# Patient Record
Sex: Female | Born: 1955 | Race: White | Hispanic: No | Smoking: Never smoker
Health system: Southern US, Community
[De-identification: ages and names within clinical notes are randomized; demographics above are authoritative.]

## PROBLEM LIST (undated history)

## (undated) DIAGNOSIS — I10 Essential (primary) hypertension: Secondary | ICD-10-CM

## (undated) DIAGNOSIS — E039 Hypothyroidism, unspecified: Secondary | ICD-10-CM

## (undated) DIAGNOSIS — M1990 Unspecified osteoarthritis, unspecified site: Secondary | ICD-10-CM

## (undated) DIAGNOSIS — G5600 Carpal tunnel syndrome, unspecified upper limb: Secondary | ICD-10-CM

## (undated) DIAGNOSIS — J302 Other seasonal allergic rhinitis: Secondary | ICD-10-CM

## (undated) DIAGNOSIS — Z973 Presence of spectacles and contact lenses: Secondary | ICD-10-CM

## (undated) HISTORY — PX: COLONOSCOPY: SHX174

## (undated) HISTORY — DX: Other seasonal allergic rhinitis: J30.2

## (undated) HISTORY — PX: TUBAL LIGATION: SHX77

## (undated) HISTORY — DX: Unspecified osteoarthritis, unspecified site: M19.90

## (undated) HISTORY — PX: BREAST SURGERY: SHX581

---
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
## 1986-09-10 HISTORY — PX: BREAST EXCISIONAL BIOPSY: SUR124

## 1989-09-10 HISTORY — PX: TUBAL LIGATION: SHX77

## 2000-09-10 HISTORY — PX: BREAST EXCISIONAL BIOPSY: SUR124

## 2002-08-13 ENCOUNTER — Encounter: Payer: Self-pay | Admitting: *Deleted

## 2002-08-13 ENCOUNTER — Encounter: Admission: RE | Admit: 2002-08-13 | Discharge: 2002-08-13 | Payer: Self-pay | Admitting: *Deleted

## 2003-10-19 ENCOUNTER — Encounter: Admission: RE | Admit: 2003-10-19 | Discharge: 2003-10-19 | Payer: Self-pay | Admitting: *Deleted

## 2004-10-27 ENCOUNTER — Encounter: Admission: RE | Admit: 2004-10-27 | Discharge: 2004-10-27 | Payer: Self-pay | Admitting: *Deleted

## 2005-10-29 ENCOUNTER — Encounter: Admission: RE | Admit: 2005-10-29 | Discharge: 2005-10-29 | Payer: Self-pay | Admitting: *Deleted

## 2006-10-31 ENCOUNTER — Encounter: Admission: RE | Admit: 2006-10-31 | Discharge: 2006-10-31 | Payer: Self-pay | Admitting: *Deleted

## 2007-11-03 ENCOUNTER — Encounter: Admission: RE | Admit: 2007-11-03 | Discharge: 2007-11-03 | Payer: Self-pay | Admitting: *Deleted

## 2008-11-03 ENCOUNTER — Encounter: Admission: RE | Admit: 2008-11-03 | Discharge: 2008-11-03 | Payer: Self-pay | Admitting: *Deleted

## 2009-05-18 ENCOUNTER — Ambulatory Visit: Payer: Self-pay | Admitting: Internal Medicine

## 2009-06-03 ENCOUNTER — Encounter: Payer: Self-pay | Admitting: Internal Medicine

## 2009-06-03 ENCOUNTER — Ambulatory Visit: Payer: Self-pay | Admitting: Internal Medicine

## 2009-06-06 ENCOUNTER — Encounter: Payer: Self-pay | Admitting: Internal Medicine

## 2009-11-07 ENCOUNTER — Encounter: Admission: RE | Admit: 2009-11-07 | Discharge: 2009-11-07 | Payer: Self-pay | Admitting: *Deleted

## 2010-10-03 ENCOUNTER — Other Ambulatory Visit: Payer: Self-pay | Admitting: *Deleted

## 2010-10-03 DIAGNOSIS — Z1239 Encounter for other screening for malignant neoplasm of breast: Secondary | ICD-10-CM

## 2010-11-13 ENCOUNTER — Ambulatory Visit: Payer: Self-pay

## 2010-11-20 ENCOUNTER — Ambulatory Visit
Admission: RE | Admit: 2010-11-20 | Discharge: 2010-11-20 | Disposition: A | Discharge status: Home / Self Care | Payer: 59 | Source: Ambulatory Visit | Attending: *Deleted | Admitting: *Deleted

## 2010-11-20 DIAGNOSIS — Z1239 Encounter for other screening for malignant neoplasm of breast: Secondary | ICD-10-CM

## 2011-07-02 ENCOUNTER — Other Ambulatory Visit: Payer: Self-pay | Admitting: Dermatology

## 2011-07-24 ENCOUNTER — Other Ambulatory Visit: Payer: Self-pay | Admitting: Obstetrics and Gynecology

## 2011-11-22 ENCOUNTER — Other Ambulatory Visit: Payer: Self-pay | Admitting: *Deleted

## 2011-11-22 DIAGNOSIS — Z1231 Encounter for screening mammogram for malignant neoplasm of breast: Secondary | ICD-10-CM

## 2011-12-07 ENCOUNTER — Ambulatory Visit: Payer: 59

## 2011-12-17 ENCOUNTER — Ambulatory Visit
Admission: RE | Admit: 2011-12-17 | Discharge: 2011-12-17 | Disposition: A | Discharge status: Home / Self Care | Payer: 59 | Source: Ambulatory Visit | Attending: *Deleted | Admitting: *Deleted

## 2011-12-17 DIAGNOSIS — Z1231 Encounter for screening mammogram for malignant neoplasm of breast: Secondary | ICD-10-CM

## 2012-07-07 ENCOUNTER — Other Ambulatory Visit: Payer: Self-pay | Admitting: Dermatology

## 2012-08-05 ENCOUNTER — Other Ambulatory Visit: Payer: Self-pay | Admitting: Obstetrics and Gynecology

## 2012-12-11 ENCOUNTER — Other Ambulatory Visit: Payer: Self-pay

## 2012-12-11 DIAGNOSIS — Z1231 Encounter for screening mammogram for malignant neoplasm of breast: Secondary | ICD-10-CM

## 2013-01-05 ENCOUNTER — Ambulatory Visit
Admission: RE | Admit: 2013-01-05 | Discharge: 2013-01-05 | Disposition: A | Discharge status: Home / Self Care | Payer: 59 | Source: Ambulatory Visit

## 2013-01-05 DIAGNOSIS — Z1231 Encounter for screening mammogram for malignant neoplasm of breast: Secondary | ICD-10-CM

## 2013-01-22 ENCOUNTER — Other Ambulatory Visit: Payer: Self-pay | Admitting: Dermatology

## 2013-09-09 ENCOUNTER — Other Ambulatory Visit: Payer: Self-pay | Admitting: Obstetrics and Gynecology

## 2013-09-10 DIAGNOSIS — G5600 Carpal tunnel syndrome, unspecified upper limb: Secondary | ICD-10-CM

## 2013-09-10 HISTORY — DX: Carpal tunnel syndrome, unspecified upper limb: G56.00

## 2013-12-07 ENCOUNTER — Other Ambulatory Visit: Payer: Self-pay

## 2013-12-07 DIAGNOSIS — Z1231 Encounter for screening mammogram for malignant neoplasm of breast: Secondary | ICD-10-CM

## 2014-01-06 ENCOUNTER — Ambulatory Visit
Admission: RE | Admit: 2014-01-06 | Discharge: 2014-01-06 | Disposition: A | Discharge status: Home / Self Care | Payer: 59 | Source: Ambulatory Visit

## 2014-01-06 DIAGNOSIS — Z1231 Encounter for screening mammogram for malignant neoplasm of breast: Secondary | ICD-10-CM

## 2014-05-10 ENCOUNTER — Encounter: Payer: Self-pay | Admitting: Internal Medicine

## 2014-05-27 ENCOUNTER — Other Ambulatory Visit: Payer: Self-pay | Admitting: Orthopedic Surgery

## 2014-06-11 ENCOUNTER — Encounter: Payer: Self-pay | Admitting: Internal Medicine

## 2014-08-31 ENCOUNTER — Encounter (HOSPITAL_BASED_OUTPATIENT_CLINIC_OR_DEPARTMENT_OTHER): Payer: Self-pay | Admitting: *Deleted

## 2014-08-31 NOTE — Progress Notes (Signed)
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

## 2014-09-06 ENCOUNTER — Encounter (HOSPITAL_BASED_OUTPATIENT_CLINIC_OR_DEPARTMENT_OTHER)
Admission: RE | Admit: 2014-09-06 | Discharge: 2014-09-06 | Disposition: A | Discharge status: Home / Self Care | Payer: 59 | Source: Ambulatory Visit | Attending: Orthopedic Surgery | Admitting: Orthopedic Surgery

## 2014-09-06 DIAGNOSIS — I10 Essential (primary) hypertension: Secondary | ICD-10-CM | POA: Diagnosis not present

## 2014-09-06 DIAGNOSIS — Z880 Allergy status to penicillin: Secondary | ICD-10-CM | POA: Diagnosis not present

## 2014-09-06 DIAGNOSIS — G5602 Carpal tunnel syndrome, left upper limb: Secondary | ICD-10-CM | POA: Diagnosis not present

## 2014-09-06 DIAGNOSIS — E039 Hypothyroidism, unspecified: Secondary | ICD-10-CM | POA: Diagnosis not present

## 2014-09-06 DIAGNOSIS — G5601 Carpal tunnel syndrome, right upper limb: Secondary | ICD-10-CM | POA: Diagnosis present

## 2014-09-06 LAB — BASIC METABOLIC PANEL
Anion gap: 10 (ref 5–15)
BUN: 15 mg/dL (ref 6–23)
CALCIUM: 9.9 mg/dL (ref 8.4–10.5)
CO2: 28 mmol/L (ref 19–32)
Chloride: 102 mEq/L (ref 96–112)
Creatinine, Ser: 0.8 mg/dL (ref 0.50–1.10)
GFR calc Af Amer: >90 mL/min (ref >90)
GFR calc non Af Amer: 79 mL/min — ABNORMAL LOW (ref >90)
Glucose, Bld: 129 mg/dL — ABNORMAL HIGH (ref 70–99)
Potassium: 3.3 mmol/L — ABNORMAL LOW (ref 3.5–5.1)
SODIUM: 140 mmol/L (ref 135–145)

## 2014-09-07 ENCOUNTER — Encounter (HOSPITAL_BASED_OUTPATIENT_CLINIC_OR_DEPARTMENT_OTHER): Payer: Self-pay | Admitting: Orthopedic Surgery

## 2014-09-07 ENCOUNTER — Ambulatory Visit (HOSPITAL_BASED_OUTPATIENT_CLINIC_OR_DEPARTMENT_OTHER)
Admission: RE | Admit: 2014-09-07 | Discharge: 2014-09-07 | Disposition: A | Discharge status: Home / Self Care | Payer: 59 | Source: Ambulatory Visit | Attending: Orthopedic Surgery | Admitting: Orthopedic Surgery

## 2014-09-07 ENCOUNTER — Ambulatory Visit (HOSPITAL_BASED_OUTPATIENT_CLINIC_OR_DEPARTMENT_OTHER): Payer: 59 | Admitting: Anesthesiology

## 2014-09-07 ENCOUNTER — Encounter (HOSPITAL_BASED_OUTPATIENT_CLINIC_OR_DEPARTMENT_OTHER)
Admission: RE | Disposition: A | Discharge status: Home / Self Care | Payer: Self-pay | Source: Ambulatory Visit | Attending: Orthopedic Surgery

## 2014-09-07 DIAGNOSIS — G5601 Carpal tunnel syndrome, right upper limb: Secondary | ICD-10-CM | POA: Diagnosis not present

## 2014-09-07 DIAGNOSIS — I10 Essential (primary) hypertension: Secondary | ICD-10-CM | POA: Insufficient documentation

## 2014-09-07 DIAGNOSIS — E039 Hypothyroidism, unspecified: Secondary | ICD-10-CM | POA: Insufficient documentation

## 2014-09-07 DIAGNOSIS — Z880 Allergy status to penicillin: Secondary | ICD-10-CM | POA: Insufficient documentation

## 2014-09-07 DIAGNOSIS — G5602 Carpal tunnel syndrome, left upper limb: Secondary | ICD-10-CM | POA: Insufficient documentation

## 2014-09-07 HISTORY — DX: Presence of spectacles and contact lenses: Z97.3

## 2014-09-07 HISTORY — DX: Essential (primary) hypertension: I10

## 2014-09-07 HISTORY — PX: CARPAL TUNNEL RELEASE: SHX101

## 2014-09-07 HISTORY — DX: Hypothyroidism, unspecified: E03.9

## 2014-09-07 HISTORY — DX: Carpal tunnel syndrome, unspecified upper limb: G56.00

## 2014-09-07 LAB — POCT HEMOGLOBIN-HEMACUE: Hemoglobin: 15.1 g/dL — ABNORMAL HIGH (ref 12.0–15.0)

## 2014-09-07 SURGERY — CARPAL TUNNEL RELEASE
Anesthesia: Monitor Anesthesia Care | Site: Wrist | Laterality: Bilateral

## 2014-09-07 MED ORDER — MIDAZOLAM HCL 5 MG/5ML IJ SOLN
INTRAMUSCULAR | Status: DC | PRN
  Administered 2014-09-07: 1 mg via INTRAVENOUS

## 2014-09-07 MED ORDER — CELECOXIB 200 MG PO CAPS: 200.0000 mg | ORAL_CAPSULE | Freq: Two times a day (BID) | ORAL | Status: DC

## 2014-09-07 MED ORDER — MIDAZOLAM HCL 2 MG/2ML IJ SOLN
INTRAMUSCULAR | Status: AC
  Filled 2014-09-07: qty 2

## 2014-09-07 MED ORDER — ONDANSETRON HCL 4 MG/2ML IJ SOLN
INTRAMUSCULAR | Status: DC | PRN
  Administered 2014-09-07: 4 mg via INTRAVENOUS

## 2014-09-07 MED ORDER — BUPIVACAINE HCL (PF) 0.25 % IJ SOLN
INTRAMUSCULAR | Status: AC
Start: 2014-09-07 — End: 2014-09-07
  Filled 2014-09-07: qty 30

## 2014-09-07 MED ORDER — FENTANYL CITRATE 0.05 MG/ML IJ SOLN
INTRAMUSCULAR | Status: DC | PRN
  Administered 2014-09-07: 50 ug via INTRAVENOUS

## 2014-09-07 MED ORDER — TRAMADOL HCL 50 MG PO TABS: 50.0000 mg | ORAL_TABLET | Freq: Four times a day (QID) | ORAL | Status: DC | PRN

## 2014-09-07 MED ORDER — HYDROMORPHONE HCL 1 MG/ML IJ SOLN: 0.2500 mg | INTRAMUSCULAR | Status: DC | PRN

## 2014-09-07 MED ORDER — VANCOMYCIN HCL IN DEXTROSE 1-5 GM/200ML-% IV SOLN
INTRAVENOUS | Status: AC
  Filled 2014-09-07: qty 200

## 2014-09-07 MED ORDER — BUPIVACAINE HCL (PF) 0.25 % IJ SOLN
INTRAMUSCULAR | Status: DC | PRN
  Administered 2014-09-07: 6 mL

## 2014-09-07 MED ORDER — FENTANYL CITRATE 0.05 MG/ML IJ SOLN: 50.0000 ug | INTRAMUSCULAR | Status: DC | PRN

## 2014-09-07 MED ORDER — CHLORHEXIDINE GLUCONATE 4 % EX LIQD: 60.0000 mL | Freq: Once | CUTANEOUS | Status: DC

## 2014-09-07 MED ORDER — MIDAZOLAM HCL 2 MG/2ML IJ SOLN: 1.0000 mg | INTRAMUSCULAR | Status: DC | PRN

## 2014-09-07 MED ORDER — OXYCODONE HCL 5 MG PO TABS: 5.0000 mg | ORAL_TABLET | Freq: Once | ORAL | Status: DC | PRN

## 2014-09-07 MED ORDER — OXYCODONE HCL 5 MG/5ML PO SOLN
5.0000 mg | Freq: Once | ORAL | Status: DC | PRN
Start: 2014-09-07 — End: 2014-09-07

## 2014-09-07 MED ORDER — FENTANYL CITRATE 0.05 MG/ML IJ SOLN
INTRAMUSCULAR | Status: AC
  Filled 2014-09-07: qty 4

## 2014-09-07 MED ORDER — LACTATED RINGERS IV SOLN
INTRAVENOUS | Status: DC
  Administered 2014-09-07: 08:00:00 via INTRAVENOUS

## 2014-09-07 MED ORDER — VANCOMYCIN HCL IN DEXTROSE 1-5 GM/200ML-% IV SOLN
1000.0000 mg | INTRAVENOUS | Status: AC
  Administered 2014-09-07: 1000 mg via INTRAVENOUS

## 2014-09-07 MED ORDER — ONDANSETRON HCL 4 MG/2ML IJ SOLN: 4.0000 mg | Freq: Once | INTRAMUSCULAR | Status: DC | PRN

## 2014-09-07 SURGICAL SUPPLY — 36 items
BLADE SURG 15 STRL LF DISP TIS (BLADE) ×1 IMPLANT
BLADE SURG 15 STRL SS (BLADE) ×6
BNDG CMPR 9X4 STRL LF SNTH (GAUZE/BANDAGES/DRESSINGS)
BNDG COHESIVE 3X5 TAN STRL LF (GAUZE/BANDAGES/DRESSINGS) ×5 IMPLANT
BNDG ESMARK 4X9 LF (GAUZE/BANDAGES/DRESSINGS) IMPLANT
BNDG GAUZE ELAST 4 BULKY (GAUZE/BANDAGES/DRESSINGS) ×5 IMPLANT
CHLORAPREP W/TINT 26ML (MISCELLANEOUS) ×5 IMPLANT
CORDS BIPOLAR (ELECTRODE) ×3 IMPLANT
COVER BACK TABLE 60X90IN (DRAPES) ×3 IMPLANT
COVER MAYO STAND STRL (DRAPES) ×5 IMPLANT
CUFF TOURNIQUET SINGLE 18IN (TOURNIQUET CUFF) ×5 IMPLANT
DRAPE EXTREMITY T 121X128X90 (DRAPE) ×5 IMPLANT
DRAPE SURG 17X23 STRL (DRAPES) ×3 IMPLANT
DRSG PAD ABDOMINAL 8X10 ST (GAUZE/BANDAGES/DRESSINGS) ×5 IMPLANT
GAUZE SPONGE 4X4 12PLY STRL (GAUZE/BANDAGES/DRESSINGS) ×3 IMPLANT
GAUZE XEROFORM 1X8 LF (GAUZE/BANDAGES/DRESSINGS) ×3 IMPLANT
GLOVE BIOGEL PI IND STRL 7.0 (GLOVE) IMPLANT
GLOVE BIOGEL PI IND STRL 8 (GLOVE) IMPLANT
GLOVE BIOGEL PI IND STRL 8.5 (GLOVE) ×1 IMPLANT
GLOVE BIOGEL PI INDICATOR 7.0 (GLOVE) ×2
GLOVE BIOGEL PI INDICATOR 8 (GLOVE) ×2
GLOVE BIOGEL PI INDICATOR 8.5 (GLOVE)
GLOVE SURG ORTHO 8.0 STRL STRW (GLOVE) ×1 IMPLANT
GOWN STRL REUS W/ TWL LRG LVL3 (GOWN DISPOSABLE) ×1 IMPLANT
GOWN STRL REUS W/TWL LRG LVL3 (GOWN DISPOSABLE) ×3
GOWN STRL REUS W/TWL XL LVL3 (GOWN DISPOSABLE) ×3 IMPLANT
NDL PRECISIONGLIDE 27X1.5 (NEEDLE) IMPLANT
NEEDLE PRECISIONGLIDE 27X1.5 (NEEDLE) IMPLANT
NS IRRIG 1000ML POUR BTL (IV SOLUTION) ×3 IMPLANT
PACK BASIN DAY SURGERY FS (CUSTOM PROCEDURE TRAY) ×3 IMPLANT
STOCKINETTE 4X48 STRL (DRAPES) ×5 IMPLANT
SUT VICRYL 4-0 PS2 18IN ABS (SUTURE) IMPLANT
SYR BULB 3OZ (MISCELLANEOUS) ×3 IMPLANT
SYR CONTROL 10ML LL (SYRINGE) ×2 IMPLANT
TOWEL OR 17X24 6PK STRL BLUE (TOWEL DISPOSABLE) ×5 IMPLANT
UNDERPAD 30X30 INCONTINENT (UNDERPADS AND DIAPERS) ×3 IMPLANT

## 2014-09-07 NOTE — Op Note (Signed)
Dictation Number [id number]

## 2014-09-07 NOTE — Discharge Instructions (Signed)

## 2014-09-07 NOTE — Anesthesia Preprocedure Evaluation (Signed)
Anesthesia Evaluation  Patient identified by MRN, date of birth, ID band Patient awake    Reviewed: Allergy & Precautions, H&P , NPO status , Patient's Chart, lab work & pertinent test results  Airway Mallampati: II  TM Distance: >3 FB Neck ROM: Full    Dental  (+) Teeth Intact, Dental Advisory Given   Pulmonary  breath sounds clear to auscultation        Cardiovascular hypertension, Pt. on medications Rhythm:Regular Rate:Normal     Neuro/Psych    GI/Hepatic   Endo/Other  Hypothyroidism   Renal/GU      Musculoskeletal   Abdominal   Peds  Hematology   Anesthesia Other Findings   Reproductive/Obstetrics                             Anesthesia Physical Anesthesia Plan  ASA: II  Anesthesia Plan: MAC and Bier Block   Post-op Pain Management:    Induction: Intravenous  Airway Management Planned: Simple Face Mask  Additional Equipment:   Intra-op Plan:   Post-operative Plan:   Informed Consent: I have reviewed the patients History and Physical, chart, labs and discussed the procedure including the risks, benefits and alternatives for the proposed anesthesia with the patient or authorized representative who has indicated his/her understanding and acceptance.   Dental advisory given  Plan Discussed with: CRNA, Anesthesiologist and Surgeon  Anesthesia Plan Comments:         Anesthesia Quick Evaluation
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
## 2014-09-07 NOTE — Anesthesia Postprocedure Evaluation (Signed)
  Anesthesia Post-op Note  Patient: Barbara Delacruz  Procedure(s) Performed: Procedure(s): RIGHT AND LEFT CARPAL TUNNEL RELEASE (Bilateral)  Patient Location: PACU  Anesthesia Type: MAC, Bier Block   Level of Consciousness: awake, alert  and oriented  Airway and Oxygen Therapy: Patient Spontanous Breathing  Post-op Pain: none  Post-op Assessment: Post-op Vital signs reviewed  Post-op Vital Signs: Reviewed  Last Vitals:  Filed Vitals:   09/07/14 1000  BP: 144/64  Pulse: 65  Temp:   Resp: 17    Complications: No apparent anesthesia complications

## 2014-09-07 NOTE — Op Note (Signed)
NAMECALIANN, LECKRONE                ACCOUNT NO.:  [id number]  MEDICAL RECORD NO.:  [id number]  LOCATION:                                 FACILITY:  PHYSICIAN:  Daryll Brod, M.D.            DATE OF BIRTH:  DATE OF PROCEDURE:  09/07/2014 DATE OF DISCHARGE:                              OPERATIVE REPORT   PREOPERATIVE DIAGNOSIS:  Bilateral carpal tunnel syndrome.  POSTOPERATIVE DIAGNOSIS:  Bilateral carpal tunnel syndrome.  OPERATION:  Bilateral carpal tunnel release.  SURGEON:  Daryll Brod, MD  ANESTHESIA:  Forearm-based IV regional with local infiltration.  ANESTHESIOLOGIST:  Lorrene Reid, MD  HISTORY:  The patient is a 58 year old female with a history of bilateral carpal tunnel syndrome not responsive to conservative treatment.  Nerve conductions are positive.  She has elected to undergo bilateral carpal tunnel release.  Pre, peri, and postoperative course have been discussed along with risks and complications.  She is aware that there is no guarantee with the surgery, possibility of infection, recurrence of injury to arteries, nerves, tendons, incomplete relief of symptoms, and dystrophy.  In the preoperative area, the patient was seen, the extremity marked by both patient and surgeon.  Antibiotic given.  PROCEDURE IN DETAIL:  The patient was brought to the operating room, where forearm-based IV regional anesthetic was carried out on the left side.  She was prepped using ChloraPrep, supine position, left arm free. A 3-minute dry time was allowed.  Time-out taken confirming the patient and procedure.  A longitudinal incision was made in the left palm, carried down through subcutaneous tissue.  Bleeders were electrocauterized.  Palmar fascia was split.  Superficial palmar arch identified.  The flexor tendon to the ring and little finger identified. To the ulnar side of the median nerve, the carpal retinaculum was incised with sharp dissection.  Right angle and Sewall retractor  were placed between skin and forearm fascia.  The fascia released for approximately a centimeter and half proximal to the wrist crease under direct vision.  Canal was explored.  Air of compression to the nerve was apparent with persistent median artery.  No further lesions were identified.  The motor branch entered into muscle.  The wound was copiously irrigated with saline.  The skin was then closed with a subcuticular 4-0 Monocryl suture.  A local infiltration with 0.25% bupivacaine without epinephrine was given, approximately 5-6 mL was used.  The skin was then dermabonded after cleaning with alcohol and drying it.  A sterile compressive dressing was applied.  On deflation of the tourniquet, all fingers immediately pinked.  The right side was attended to next.  An IV regional anesthetic forearm based was then given on the right side.  She was prepped using ChloraPrep, supine position.  The 3-minute dry time was allowed.  Time-out taken confirming the patient and procedure.  A longitudinal incision was made in the right palm.  She had some feeling with the local infiltration with 0.25% bupivacaine was then given without epinephrine approximately 5 mL was used.  The wound was deepened.  The palmar fascia was split. Superficial palmar arch identified.  The flexor tendon to the  ring and little finger identified.  To the ulnar side of the median nerve, the carpal retinaculum was incised with sharp dissection.  Right angle and Sewall retractor were placed between skin and forearm fascia.  The fascia released for approximately a centimeter and half to 2 cm under the skin proximal to the wrist crease under direct vision.  Canal was explored.  Again, air of compression to the nerve was apparent along with persistent median artery.  Motor branch entered into muscle.  Air of compression was noted with an area of hyperemia to the nerve.  Wound was copiously irrigated with saline, and the skin  closed with subcuticular 4-0 Monocryl suture.  Dermabond was applied after cleaning the wound with alcohol leaving it dry.  Bleeders were electrocauterized throughout the procedure with bipolar.  A sterile compressive dressing was applied.  On deflation of the tourniquet, all fingers immediately pinked.  She was taken to the recovery room for observation in satisfactory condition.  She will be discharged home to return to the [facility] in 1 week.  She was given a prescription for Celebrex to take 200 mg two a day for 4 days, and intermittently we will also give her a prescription for Ultram 50 mg.  She will return in 1 week.          ______________________________ Daryll Brod, M.D.     GK/MEDQ  D:  09/07/2014  T:  09/07/2014  Job:  [id number]

## 2014-09-07 NOTE — Brief Op Note (Signed)
09/07/2014  10:03 AM  PATIENT:  Barbara Delacruz  58 y.o. female  PRE-OPERATIVE DIAGNOSIS:  RIGHT CARPAL TUNNEL SYNDRROME,LEFT CARPAL TUNNEL SYNDROME  POST-OPERATIVE DIAGNOSIS:  right and left severe carpal tunnel syndrome  PROCEDURE:  Procedure(s): RIGHT AND LEFT CARPAL TUNNEL RELEASE (Bilateral)  SURGEON:  Surgeon(s) and Role:    * Daryll Brod, MD - Primary  PHYSICIAN ASSISTANT:   ASSISTANTS: none   ANESTHESIA:   local and regional  EBL:  Total I/O In: 800 [I.V.:800] Out: -   BLOOD ADMINISTERED:none  DRAINS: none   LOCAL MEDICATIONS USED:  BUPIVICAINE   SPECIMEN:  No Specimen  DISPOSITION OF SPECIMEN:  N/A  COUNTS:  YES  TOURNIQUET:   Total Tourniquet Time Documented: Forearm (Left) - 30 minutes Forearm (Left) - 30 minutes Total: Forearm (Left) - 60 minutes   DICTATION: .Other Dictation: Dictation Number [id number]  PLAN OF CARE: Discharge to home after PACU  PATIENT DISPOSITION:  PACU - hemodynamically stable.

## 2014-09-07 NOTE — Transfer of Care (Signed)
Immediate Anesthesia Transfer of Care Note  Patient: Barbara Delacruz  Procedure(s) Performed: Procedure(s): RIGHT AND LEFT CARPAL TUNNEL RELEASE (Bilateral)  Patient Location: PACU  Anesthesia Type:Bier block  Level of Consciousness: awake, alert , oriented and patient cooperative  Airway & Oxygen Therapy: Patient Spontanous Breathing and Patient connected to face mask oxygen  Post-op Assessment: Report given to PACU RN and Post -op Vital signs reviewed and stable  Post vital signs: Reviewed and stable  Complications: No apparent anesthesia complications

## 2014-09-08 ENCOUNTER — Encounter (HOSPITAL_BASED_OUTPATIENT_CLINIC_OR_DEPARTMENT_OTHER): Payer: Self-pay | Admitting: Orthopedic Surgery

## 2014-09-28 ENCOUNTER — Other Ambulatory Visit: Payer: Self-pay | Admitting: Obstetrics and Gynecology

## 2014-09-29 LAB — CYTOLOGY - PAP

## 2014-11-10 ENCOUNTER — Encounter: Payer: Self-pay | Admitting: Internal Medicine

## 2014-11-30 ENCOUNTER — Other Ambulatory Visit: Payer: Self-pay

## 2014-11-30 DIAGNOSIS — Z1231 Encounter for screening mammogram for malignant neoplasm of breast: Secondary | ICD-10-CM

## 2015-01-10 ENCOUNTER — Ambulatory Visit: Payer: Self-pay

## 2015-01-20 ENCOUNTER — Ambulatory Visit
Admission: RE | Admit: 2015-01-20 | Discharge: 2015-01-20 | Disposition: A | Discharge status: Home / Self Care | Payer: 59 | Source: Ambulatory Visit

## 2015-01-20 DIAGNOSIS — Z1231 Encounter for screening mammogram for malignant neoplasm of breast: Secondary | ICD-10-CM

## 2015-02-28 ENCOUNTER — Encounter: Payer: Self-pay | Admitting: Internal Medicine

## 2015-05-26 ENCOUNTER — Encounter: Payer: 59 | Admitting: Internal Medicine

## 2015-07-04 ENCOUNTER — Encounter: Payer: 59 | Admitting: Gastroenterology

## 2015-09-11 HISTORY — PX: COLONOSCOPY: SHX174

## 2015-09-11 HISTORY — PX: POLYPECTOMY: SHX149

## 2015-10-13 ENCOUNTER — Encounter: Payer: Self-pay | Admitting: Gastroenterology

## 2015-12-02 ENCOUNTER — Ambulatory Visit (AMBULATORY_SURGERY_CENTER): Payer: Self-pay | Admitting: *Deleted

## 2015-12-02 VITALS — Ht 64.0 in | Wt 224.0 lb

## 2015-12-02 DIAGNOSIS — Z86010 Personal history of colonic polyps: Secondary | ICD-10-CM

## 2015-12-02 MED ORDER — NA SULFATE-K SULFATE-MG SULF 17.5-3.13-1.6 GM/177ML PO SOLN: 1.0000 | Freq: Once | ORAL | Status: DC

## 2015-12-02 NOTE — Progress Notes (Signed)
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 Always had versed and fentanyl.  No home O2.  No diet meds.

## 2015-12-16 ENCOUNTER — Ambulatory Visit (AMBULATORY_SURGERY_CENTER): Payer: 59 | Admitting: Gastroenterology

## 2015-12-16 ENCOUNTER — Encounter: Payer: Self-pay | Admitting: Gastroenterology

## 2015-12-16 VITALS — BP 104/53 | HR 70 | Temp 96.2°F | Resp 11 | Ht 64.0 in | Wt 224.0 lb

## 2015-12-16 DIAGNOSIS — Z86010 Personal history of colonic polyps: Secondary | ICD-10-CM | POA: Diagnosis not present

## 2015-12-16 DIAGNOSIS — D124 Benign neoplasm of descending colon: Secondary | ICD-10-CM | POA: Diagnosis not present

## 2015-12-16 DIAGNOSIS — D125 Benign neoplasm of sigmoid colon: Secondary | ICD-10-CM | POA: Diagnosis not present

## 2015-12-16 MED ORDER — SODIUM CHLORIDE 0.9 % IV SOLN: 500.0000 mL | INTRAVENOUS | Status: DC

## 2015-12-16 NOTE — Progress Notes (Signed)
Report to PACU, RN, vss, BBS= Clear.  

## 2015-12-16 NOTE — Op Note (Signed)
[facility] Patient Name: Barbara Delacruz Procedure Date: 12/16/2015 9:21 AM MRN: [id number] Endoscopist: Mauri Pole , MD Age: 60 Date of Birth: 07/15/1956 Gender: Female Procedure:                Colonoscopy Indications:              Surveillance: Personal history of adenomatous                            polyps on last colonoscopy > 5 years ago Medicines:                Monitored Anesthesia Care Procedure:                Pre-Anesthesia Assessment:                           - Prior to the procedure, a History and Physical                            was performed, and patient medications and                            allergies were reviewed. The patient's tolerance of                            previous anesthesia was also reviewed. The risks                            and benefits of the procedure and the sedation                            options and risks were discussed with the patient.                            All questions were answered, and informed consent                            was obtained. Prior Anticoagulants: The patient has                            taken no previous anticoagulant or antiplatelet                            agents. ASA Grade Assessment: II - A patient with                            mild systemic disease. After reviewing the risks                            and benefits, the patient was deemed in                            satisfactory condition to undergo the procedure.  After obtaining informed consent, the colonoscope                            was passed under direct vision. Throughout the                            procedure, the patient's blood pressure, pulse, and                            oxygen saturations were monitored continuously. The                            Model CF-HQ190L [id number]) scope was introduced                            through the anus and advanced to the the cecum,                    identified by appendiceal orifice and ileocecal                            valve. The colonoscopy was somewhat difficult due                            to significant looping. Successful completion of                            the procedure was aided by applying abdominal                            pressure. The patient tolerated the procedure well.                            The quality of the bowel preparation was good. The                            ileocecal valve, appendiceal orifice, and rectum                            were photographed. Scope In: 9:27:42 AM Scope Out: 9:50:27 AM Scope Withdrawal Time: 0 hours 11 minutes 42 seconds  Total Procedure Duration: 0 hours 22 minutes 45 seconds  Findings:                 The perianal and digital rectal examinations were                            normal. Melanosis coli                           A 3 mm polyp was found in the sigmoid colon. The                            polyp was sessile. The polyp was removed with a  cold biopsy forceps. Resection and retrieval were                            complete.                           A 5 mm polyp was found in the descending colon. The                            polyp was sessile. The polyp was removed with a                            cold snare. Resection and retrieval were complete.                           A few small-mouthed diverticula were found in the                            sigmoid colon and descending colon.                           The exam was otherwise without abnormality. Complications:            No immediate complications. Estimated Blood Loss:     Estimated blood loss: none. Impression:               - One 3 mm polyp in the sigmoid colon, removed with                            a cold biopsy forceps. Resected and retrieved.                           - One 5 mm polyp in the descending colon, removed                             with a cold snare. Resected and retrieved.                           - Diverticulosis in the sigmoid colon and in the                            descending colon.                           - The examination was otherwise normal. Recommendation:           - Patient has a contact number available for                            emergencies. The signs and symptoms of potential                            delayed complications were discussed with the  patient. Return to normal activities tomorrow.                            Written discharge instructions were provided to the                            patient.                           - Resume previous diet.                           - Continue present medications.                           - Await pathology results.                           - Repeat colonoscopy in 5 years for surveillance. Mauri Pole, MD 12/16/2015 9:57:07 AM This report has been signed electronically.

## 2015-12-16 NOTE — Progress Notes (Signed)
Called to room to assist during endoscopic procedure.  Patient ID and intended procedure confirmed with present staff. Received instructions for my participation in the procedure from the performing physician.  

## 2015-12-16 NOTE — Patient Instructions (Signed)
YOU HAD AN ENDOSCOPIC PROCEDURE TODAY AT [facility]:   Refer to the procedure report that was given to you for any specific questions about what was found during the examination.  If the procedure report does not answer your questions, please call your gastroenterologist to clarify.  If you requested that your care partner not be given the details of your procedure findings, then the procedure report has been included in a sealed envelope for you to review at your convenience later.  YOU SHOULD EXPECT: Some feelings of bloating in the abdomen. Passage of more gas than usual.  Walking can help get rid of the air that was put into your GI tract during the procedure and reduce the bloating. If you had a lower endoscopy (such as a colonoscopy or flexible sigmoidoscopy) you may notice spotting of blood in your stool or on the toilet paper. If you underwent a bowel prep for your procedure, you may not have a normal bowel movement for a few days.  Please Note:  You might notice some irritation and congestion in your nose or some drainage.  This is from the oxygen used during your procedure.  There is no need for concern and it should clear up in a day or so.  SYMPTOMS TO REPORT IMMEDIATELY:   Following lower endoscopy (colonoscopy or flexible sigmoidoscopy):  Excessive amounts of blood in the stool  Significant tenderness or worsening of abdominal pains  Swelling of the abdomen that is new, acute  Fever of 100F or higher   For urgent or emergent issues, a gastroenterologist can be reached at any hour by calling [phone].   DIET: Your first meal following the procedure should be a small meal and then it is ok to progress to your normal diet. Heavy or fried foods are harder to digest and may make you feel nauseous or bloated.  Likewise, meals heavy in dairy and vegetables can increase bloating.  Drink plenty of fluids but you should avoid alcoholic beverages for 24 hours.  Try to  increase the fiber in your diet, and drink plenty of water.  ACTIVITY:  You should plan to take it easy for the rest of today and you should NOT DRIVE or use heavy machinery until tomorrow (because of the sedation medicines used during the test).    FOLLOW UP: Our staff will call the number listed on your records the next business day following your procedure to check on you and address any questions or concerns that you may have regarding the information given to you following your procedure. If we do not reach you, we will leave a message.  However, if you are feeling well and you are not experiencing any problems, there is no need to return our call.  We will assume that you have returned to your regular daily activities without incident.  Thank-you for choosing [facility] for your healthcare needs today.  If any biopsies were taken you will be contacted by phone or by letter within the next 1-3 weeks.  Please call us at [phone] if you have not heard about the biopsies in 3 weeks.    SIGNATURES/CONFIDENTIALITY: You and/or your care partner have signed paperwork which will be entered into your electronic medical record.  These signatures attest to the fact that that the information above on your After Visit Summary has been reviewed and is understood.  Full responsibility of the confidentiality of this discharge information lies with you and/or your care-partner.

## 2015-12-19 ENCOUNTER — Telehealth: Payer: Self-pay | Admitting: *Deleted

## 2015-12-19 NOTE — Telephone Encounter (Signed)
  Follow up Call-  Call back number 12/16/2015  Post procedure Call Back phone  # [phone]  Permission to leave phone message Yes     Patient questions:  Message left to call us if necessary.

## 2015-12-22 ENCOUNTER — Other Ambulatory Visit: Payer: Self-pay

## 2015-12-22 DIAGNOSIS — Z1231 Encounter for screening mammogram for malignant neoplasm of breast: Secondary | ICD-10-CM

## 2015-12-28 ENCOUNTER — Encounter: Payer: Self-pay | Admitting: Gastroenterology

## 2016-01-24 ENCOUNTER — Inpatient Hospital Stay: Admission: RE | Admit: 2016-01-24 | Payer: 59 | Source: Ambulatory Visit

## 2016-02-02 DIAGNOSIS — M222X1 Patellofemoral disorders, right knee: Secondary | ICD-10-CM | POA: Insufficient documentation

## 2016-02-08 ENCOUNTER — Ambulatory Visit
Admission: RE | Admit: 2016-02-08 | Discharge: 2016-02-08 | Disposition: A | Discharge status: Home / Self Care | Payer: 59 | Source: Ambulatory Visit

## 2016-02-08 DIAGNOSIS — Z1231 Encounter for screening mammogram for malignant neoplasm of breast: Secondary | ICD-10-CM

## 2016-05-24 DIAGNOSIS — M76829 Posterior tibial tendinitis, unspecified leg: Secondary | ICD-10-CM | POA: Insufficient documentation

## 2016-09-10 HISTORY — PX: HYSTEROSCOPY WITH D & C: SHX1775

## 2017-01-02 ENCOUNTER — Other Ambulatory Visit: Payer: Self-pay | Admitting: *Deleted

## 2017-01-02 DIAGNOSIS — Z1231 Encounter for screening mammogram for malignant neoplasm of breast: Secondary | ICD-10-CM

## 2017-02-21 ENCOUNTER — Other Ambulatory Visit: Payer: Self-pay | Admitting: Obstetrics and Gynecology

## 2017-02-22 ENCOUNTER — Ambulatory Visit: Payer: 59

## 2017-03-25 ENCOUNTER — Ambulatory Visit: Payer: 59

## 2017-03-27 ENCOUNTER — Ambulatory Visit
Admission: RE | Admit: 2017-03-27 | Discharge: 2017-03-27 | Disposition: A | Discharge status: Home / Self Care | Payer: 59 | Source: Ambulatory Visit | Attending: *Deleted | Admitting: *Deleted

## 2017-03-27 DIAGNOSIS — Z1231 Encounter for screening mammogram for malignant neoplasm of breast: Secondary | ICD-10-CM

## 2017-12-05 DIAGNOSIS — M1712 Unilateral primary osteoarthritis, left knee: Secondary | ICD-10-CM | POA: Insufficient documentation

## 2018-02-17 ENCOUNTER — Other Ambulatory Visit: Payer: Self-pay | Admitting: *Deleted

## 2018-02-17 DIAGNOSIS — Z1231 Encounter for screening mammogram for malignant neoplasm of breast: Secondary | ICD-10-CM

## 2018-03-31 ENCOUNTER — Ambulatory Visit
Admission: RE | Admit: 2018-03-31 | Discharge: 2018-03-31 | Disposition: A | Discharge status: Home / Self Care | Payer: 59 | Source: Ambulatory Visit | Attending: *Deleted | Admitting: *Deleted

## 2018-03-31 DIAGNOSIS — Z1231 Encounter for screening mammogram for malignant neoplasm of breast: Secondary | ICD-10-CM

## 2019-03-09 ENCOUNTER — Other Ambulatory Visit: Payer: Self-pay | Admitting: *Deleted

## 2019-03-09 DIAGNOSIS — Z1231 Encounter for screening mammogram for malignant neoplasm of breast: Secondary | ICD-10-CM

## 2019-04-23 ENCOUNTER — Other Ambulatory Visit: Payer: Self-pay | Admitting: Internal Medicine

## 2019-04-23 ENCOUNTER — Other Ambulatory Visit: Payer: Self-pay

## 2019-04-23 ENCOUNTER — Ambulatory Visit
Admission: RE | Admit: 2019-04-23 | Discharge: 2019-04-23 | Disposition: A | Discharge status: Home / Self Care | Payer: 59 | Source: Ambulatory Visit | Attending: *Deleted | Admitting: *Deleted

## 2019-04-23 DIAGNOSIS — Z1231 Encounter for screening mammogram for malignant neoplasm of breast: Secondary | ICD-10-CM

## 2020-03-15 ENCOUNTER — Other Ambulatory Visit: Payer: Self-pay | Admitting: Internal Medicine

## 2020-03-15 DIAGNOSIS — Z1231 Encounter for screening mammogram for malignant neoplasm of breast: Secondary | ICD-10-CM

## 2020-04-25 ENCOUNTER — Ambulatory Visit: Payer: 59

## 2020-05-11 ENCOUNTER — Ambulatory Visit
Admission: RE | Admit: 2020-05-11 | Discharge: 2020-05-11 | Disposition: A | Discharge status: Home / Self Care | Payer: 59 | Source: Ambulatory Visit | Attending: Internal Medicine | Admitting: Internal Medicine

## 2020-05-11 ENCOUNTER — Other Ambulatory Visit: Payer: Self-pay

## 2020-05-11 DIAGNOSIS — Z1231 Encounter for screening mammogram for malignant neoplasm of breast: Secondary | ICD-10-CM

## 2021-01-09 ENCOUNTER — Encounter: Payer: Self-pay | Admitting: Gastroenterology

## 2021-02-28 ENCOUNTER — Encounter: Payer: Self-pay | Admitting: Gastroenterology

## 2021-05-03 ENCOUNTER — Other Ambulatory Visit: Payer: Self-pay | Admitting: Internal Medicine

## 2021-05-03 DIAGNOSIS — Z1231 Encounter for screening mammogram for malignant neoplasm of breast: Secondary | ICD-10-CM

## 2021-05-30 ENCOUNTER — Ambulatory Visit
Admission: RE | Admit: 2021-05-30 | Discharge: 2021-05-30 | Disposition: A | Discharge status: Home / Self Care | Payer: Medicare Other | Source: Ambulatory Visit | Attending: Internal Medicine | Admitting: Internal Medicine

## 2021-05-30 ENCOUNTER — Other Ambulatory Visit: Payer: Self-pay

## 2021-05-30 DIAGNOSIS — Z1231 Encounter for screening mammogram for malignant neoplasm of breast: Secondary | ICD-10-CM

## 2021-06-07 ENCOUNTER — Encounter: Payer: 59 | Admitting: Gastroenterology

## 2021-06-19 DIAGNOSIS — Z780 Asymptomatic menopausal state: Secondary | ICD-10-CM | POA: Insufficient documentation

## 2021-06-27 ENCOUNTER — Ambulatory Visit (AMBULATORY_SURGERY_CENTER): Payer: Medicare Other

## 2021-06-27 ENCOUNTER — Other Ambulatory Visit: Payer: Self-pay

## 2021-06-27 VITALS — Ht 64.0 in | Wt 210.0 lb

## 2021-06-27 DIAGNOSIS — Z86010 Personal history of colonic polyps: Secondary | ICD-10-CM

## 2021-06-27 NOTE — Progress Notes (Signed)

## 2021-07-12 ENCOUNTER — Encounter: Payer: Self-pay | Admitting: Gastroenterology

## 2021-07-12 ENCOUNTER — Ambulatory Visit (AMBULATORY_SURGERY_CENTER): Payer: Medicare Other | Admitting: Gastroenterology

## 2021-07-12 ENCOUNTER — Other Ambulatory Visit: Payer: Self-pay

## 2021-07-12 VITALS — BP 121/65 | HR 68 | Temp 97.1°F | Resp 12 | Ht 64.0 in | Wt 210.0 lb

## 2021-07-12 DIAGNOSIS — D125 Benign neoplasm of sigmoid colon: Secondary | ICD-10-CM

## 2021-07-12 DIAGNOSIS — D122 Benign neoplasm of ascending colon: Secondary | ICD-10-CM

## 2021-07-12 DIAGNOSIS — Z86010 Personal history of colonic polyps: Secondary | ICD-10-CM | POA: Diagnosis not present

## 2021-07-12 DIAGNOSIS — D124 Benign neoplasm of descending colon: Secondary | ICD-10-CM | POA: Diagnosis not present

## 2021-07-12 DIAGNOSIS — D123 Benign neoplasm of transverse colon: Secondary | ICD-10-CM

## 2021-07-12 DIAGNOSIS — D127 Benign neoplasm of rectosigmoid junction: Secondary | ICD-10-CM

## 2021-07-12 MED ORDER — SODIUM CHLORIDE 0.9 % IV SOLN: 500.0000 mL | INTRAVENOUS | Status: DC

## 2021-07-12 NOTE — Progress Notes (Signed)
Pt transferred to pacu vss nad

## 2021-07-12 NOTE — Progress Notes (Signed)
Called to room to assist during endoscopic procedure.  Patient ID and intended procedure confirmed with present staff. Received instructions for my participation in the procedure from the performing physician.  

## 2021-07-12 NOTE — Progress Notes (Signed)
[facility] History and Physical   Primary Care Physician:  Cain Saupe, MD   Reason for Procedure:  History of adenomatous colon polyps  Plan:    Surveillance colonoscopy with possible interventions as needed     HPI: Barbara Delacruz is a very pleasant 65 y.o. femalehere for screening colonoscopy. Denies any nausea, vomiting, abdominal pain, melena or bright red blood per rectum  The risks and benefits as well as alternatives of endoscopic procedure(s) have been discussed and reviewed. All questions answered. The patient agrees to proceed.    Past Medical History:  Diagnosis Date   Carpal tunnel syndrome 2015   bilateral-sx   Hypertension    on meds   Hypothyroidism    on meds   Seasonal allergies    Wears glasses     Past Surgical History:  Procedure Laterality Date   BREAST EXCISIONAL BIOPSY Left 1988   CARPAL TUNNEL RELEASE Bilateral 09/07/2014   Procedure: RIGHT AND LEFT CARPAL TUNNEL RELEASE;  Surgeon: Daryll Brod, MD;  Location: [facility];  Service: Orthopedics;  Laterality: Bilateral;   COLONOSCOPY  2017   KN-MAC-suprep(good)-tics/TA x 2- 5 yr recall   HYSTEROSCOPY WITH D & C  2018   POLYPECTOMY  2017   TA x 2   TUBAL LIGATION  1991   WISDOM TOOTH EXTRACTION  1974    Prior to Admission medications   Medication Sig Start Date End Date Taking? Authorizing Provider  B Complex Vitamins (VITAMIN-B COMPLEX PO) Take by mouth.    [provider]  celecoxib (CELEBREX) 200 MG capsule Take 200 mg by mouth daily as needed.    [provider]  cycloSPORINE (RESTASIS) 0.05 % ophthalmic emulsion Place 1 drop into both eyes 2 (two) times daily.    [provider]  estradiol-norethindrone [facility]) 0.05-0.25 MG/DAY Place 1 patch onto the skin 2 (two) times a week.    [provider]  fexofenadine (ALLEGRA) 180 MG tablet Take 180 mg by mouth daily as needed for allergies or rhinitis.    [provider]  hydrochlorothiazide (HYDRODIURIL) 25 MG tablet Take 25 mg by mouth daily.    [provider]  levothyroxine (SYNTHROID, LEVOTHROID) 150 MCG tablet Take 150 mcg by mouth daily before breakfast.    [provider]  losartan (COZAAR) 50 MG tablet Take 50 mg by mouth daily.    [provider]  Multiple Vitamins-Minerals (MULTIVITAMIN WITH MINERALS) tablet Take 1 tablet by mouth daily.    [provider]  Olopatadine HCl 0.2 % SOLN Pataday 0.2 % eye drops  USE 1 DROP IN OU QD    [provider]  VITAMIN D, CHOLECALCIFEROL, PO Take by mouth.    [provider]    Current Outpatient Medications  Medication Sig Dispense Refill   B Complex Vitamins (VITAMIN-B COMPLEX PO) Take by mouth.     celecoxib (CELEBREX) 200 MG capsule Take 200 mg by mouth daily as needed.     cycloSPORINE (RESTASIS) 0.05 % ophthalmic emulsion Place 1 drop into both eyes 2 (two) times daily.     estradiol-norethindrone (COMBIPATCH) 0.05-0.25 MG/DAY Place 1 patch onto the skin 2 (two) times a week.     fexofenadine (ALLEGRA) 180 MG tablet Take 180 mg by mouth daily as needed for allergies or rhinitis.     hydrochlorothiazide (HYDRODIURIL) 25 MG tablet Take 25 mg by mouth daily.     levothyroxine (SYNTHROID, LEVOTHROID) 150 MCG tablet Take 150 mcg by mouth  daily before breakfast.     losartan (COZAAR) 50 MG tablet Take 50 mg by mouth daily.     Multiple Vitamins-Minerals (MULTIVITAMIN WITH MINERALS) tablet Take 1 tablet by mouth daily.     Olopatadine HCl 0.2 % SOLN Pataday 0.2 % eye drops  USE 1 DROP IN OU QD     VITAMIN D, CHOLECALCIFEROL, PO Take by mouth.     No current facility-administered medications for this visit.    Allergies as of 07/12/2021 - Review Complete 06/27/2021  Allergen Reaction Noted   Cephalosporins Hives and Other (See Comments) 05/18/2009   Sulfamethoxazole-trimethoprim Hives and Other (See Comments) 05/18/2009   Codeine Other  (See Comments) 05/18/2009   Ibuprofen Other (See Comments) 05/18/2009   Latex Itching and Other (See Comments) 05/18/2009   Meperidine hcl  05/18/2009   Naproxen Other (See Comments) 05/18/2009   Penicillins Swelling and Other (See Comments) 05/18/2009    Family History  Problem Relation Age of Onset   Diabetes Mother    Heart disease Mother    Diabetes Father    Heart disease Father    Colon cancer Paternal Uncle    Prostate cancer Paternal Uncle    Colon polyps Neg Hx    Esophageal cancer Neg Hx    Rectal cancer Neg Hx    Stomach cancer Neg Hx     Social History   Socioeconomic History   Marital status: Divorced    Spouse name: Not on file   Number of children: Not on file   Years of education: Not on file   Highest education level: Not on file  Occupational History   Not on file  Tobacco Use   Smoking status: Never   Smokeless tobacco: Never  Vaping Use   Vaping Use: Never used  Substance and Sexual Activity   Alcohol use: Not Currently    Comment: special occasions   Drug use: No   Sexual activity: Not on file  Other Topics Concern   Not on file  Social History Narrative   Not on file   Social Determinants of Health   Financial Resource Strain: Not on file  Food Insecurity: Not on file  Transportation Needs: Not on file  Physical Activity: Not on file  Stress: Not on file  Social Connections: Not on file  Intimate Partner Violence: Not on file    Review of Systems:  All other review of systems negative except as mentioned in the HPI.  Physical Exam: Vital signs in last 24 hours: BP 124/90   Pulse 76   Temp (!) 97.1 F (36.2 C) (Temporal)   Ht _0  (1.626 m)   Wt 210 lb (95.3 kg)   BMI 36.05 kg/m     General:   Alert, NAD Lungs:  Clear .   Heart:  Regular rate and rhythm Abdomen:  Soft, nontender and nondistended. Neuro/Psych:  Alert and cooperative. Normal mood and affect. A and O x 3  Reviewed labs, radiology imaging, old records  and pertinent past GI work up  Patient is appropriate for planned procedure(s) and anesthesia in an ambulatory setting   K. Denzil Magnuson , MD [phone]

## 2021-07-12 NOTE — Patient Instructions (Signed)

## 2021-07-12 NOTE — Progress Notes (Signed)
Pt's states no medical or surgical changes since previsit or office visit. 

## 2021-07-12 NOTE — Op Note (Signed)
[facility] Patient Name: Barbara Delacruz Procedure Date: 07/12/2021 10:53 AM MRN: [id number] Endoscopist: Mauri Pole , MD Age: 65 Referring MD:  Date of Birth: 08/24/1956 Gender: Female Account #: [id number] Procedure:                Colonoscopy Indications:              High risk colon cancer surveillance: Personal                            history of colonic polyps, High risk colon cancer                            surveillance: Personal history of adenoma less than                            10 mm in size Medicines:                Monitored Anesthesia Care Procedure:                Pre-Anesthesia Assessment:                           - Prior to the procedure, a History and Physical                            was performed, and patient medications and                            allergies were reviewed. The patient's tolerance of                            previous anesthesia was also reviewed. The risks                            and benefits of the procedure and the sedation                            options and risks were discussed with the patient.                            All questions were answered, and informed consent                            was obtained. Prior Anticoagulants: The patient has                            taken no previous anticoagulant or antiplatelet                            agents. ASA Grade Assessment: II - A patient with                            mild systemic disease. After reviewing the risks  and benefits, the patient was deemed in                            satisfactory condition to undergo the procedure.                           After obtaining informed consent, the colonoscope                            was passed under direct vision. Throughout the                            procedure, the patient's blood pressure, pulse, and                            oxygen saturations were monitored  continuously. The                            PCF-HQ190L Colonoscope was introduced through the                            anus and advanced to the the cecum, identified by                            appendiceal orifice and ileocecal valve. The                            patient tolerated the procedure well. The                            colonoscopy was somewhat difficult due to                            inadequate bowel prep. Successful completion of the                            procedure was aided by lavage. The quality of the                            bowel preparation was adequate to identify polyps 6                            mm and larger in size. The ileocecal valve,                            appendiceal orifice, and rectum were photographed. Scope In: 11:08:27 AM Scope Out: 11:45:32 AM Scope Withdrawal Time: 0 hours 13 minutes 47 seconds  Total Procedure Duration: 0 hours 37 minutes 5 seconds  Findings:                 The perianal and digital rectal examinations were                            normal.  Ten sessile polyps were found in the recto-sigmoid                            colon X2, sigmoid colon X1, descending colon X3,                            transverse colon X3 and ascending colon X1. The                            polyps were 4 to 9 mm in size. These polyps were                            removed with a cold snare. Resection and retrieval                            were complete.                           Scattered small and large-mouthed diverticula were                            found in the sigmoid colon, descending colon and                            ascending colon.                           Non-bleeding external and internal hemorrhoids were                            found during retroflexion. The hemorrhoids were                            medium-sized. Complications:            No immediate complications. Estimated Blood  Loss:     Estimated blood loss was minimal. Impression:               - Ten 4 to 9 mm polyps at the recto-sigmoid colon,                            in the sigmoid colon, in the descending colon, in                            the transverse colon and in the ascending colon,                            removed with a cold snare. Resected and retrieved.                           - Diverticulosis in the sigmoid colon, in the                            descending colon and in the ascending colon.                           -  Non-bleeding external and internal hemorrhoids. Recommendation:           - Patient has a contact number available for                            emergencies. The signs and symptoms of potential                            delayed complications were discussed with the                            patient. Return to normal activities tomorrow.                            Written discharge instructions were provided to the                            patient.                           - Resume previous diet.                           - Continue present medications.                           - Await pathology results.                           - Repeat colonoscopy in 1-3 years for surveillance                            based on pathology results.                           - For future colonoscopy the patient will require                            an extended preparation. If there are any                            questions, please contact the gastroenterologist. Mauri Pole, MD 07/12/2021 11:50:55 AM This report has been signed electronically.

## 2021-07-14 ENCOUNTER — Telehealth: Payer: Self-pay

## 2021-07-14 NOTE — Telephone Encounter (Signed)
  Follow up Call-  Call back number 07/12/2021  Post procedure Call Back phone  # [phone]  Permission to leave phone message Yes  Some recent data might be hidden     Patient questions:  Do you have a fever, pain , or abdominal swelling? No. Pain Score  0 *  Have you tolerated food without any problems? Yes.    Have you been able to return to your normal activities? Yes.    Do you have any questions about your discharge instructions: Diet   No. Medications  No. Follow up visit  No.  Do you have questions or concerns about your Care? No.  Actions: * If pain score is 4 or above: No action needed, pain <4.

## 2021-07-18 ENCOUNTER — Encounter: Payer: Self-pay | Admitting: Gastroenterology

## 2022-02-13 IMAGING — MG MM DIGITAL SCREENING BILAT W/ TOMO AND CAD
8 series · 8 of 24 positions shown · non-contrast
Comparison: Previous exam(s).

ACR Breast Density Category a: The breast tissue is almost entirely
fatty.

CLINICAL DATA: Screening.

EXAM:
DIGITAL SCREENING BILATERAL MAMMOGRAM WITH TOMOSYNTHESIS AND CAD
TECHNIQUE: Bilateral screening digital craniocaudal and mediolateral oblique
mammograms were obtained. Bilateral screening digital breast
tomosynthesis was performed. The images were evaluated with
computer-aided detection.

[R CC synth-2D]
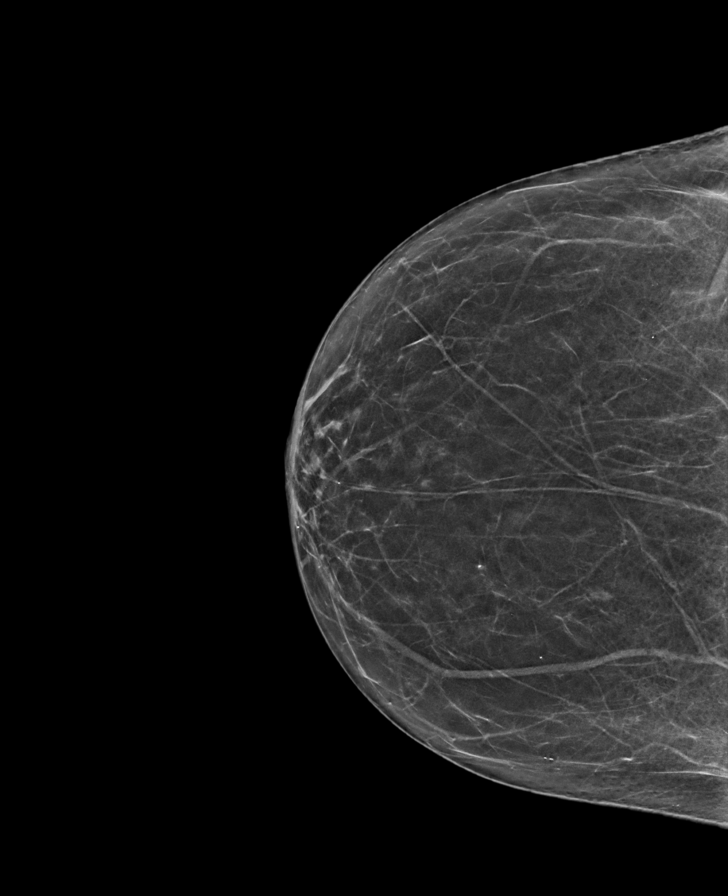

[R MLO synth-2D]
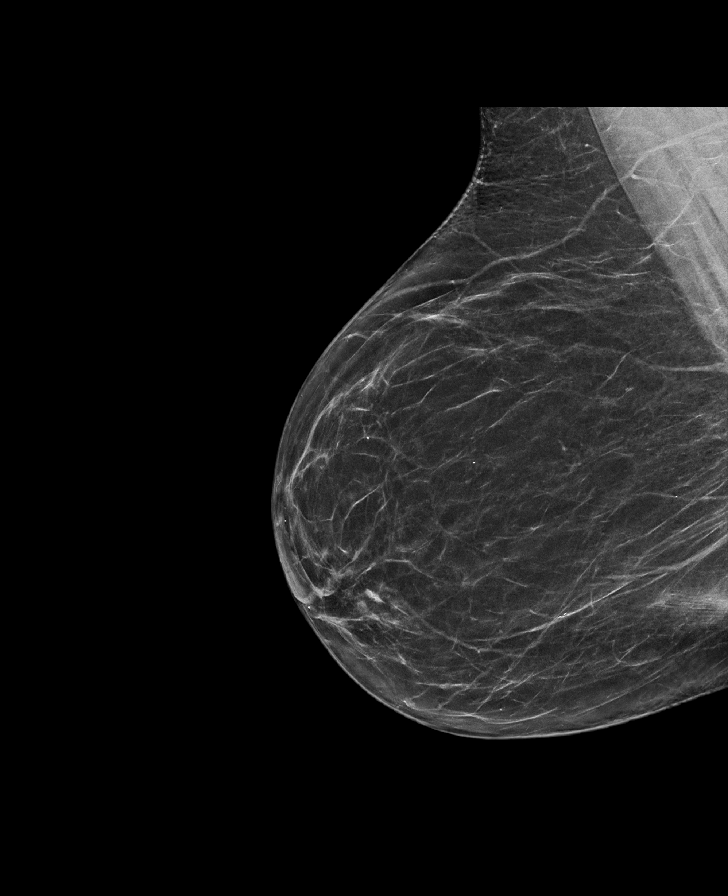

[L MLO synth-2D]
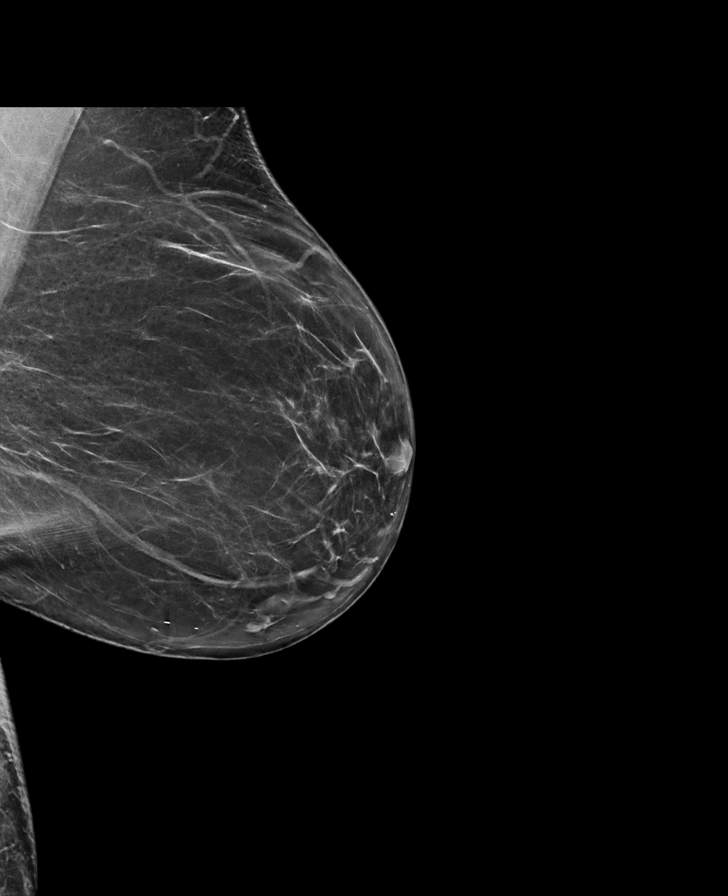

[L CC synth-2D]
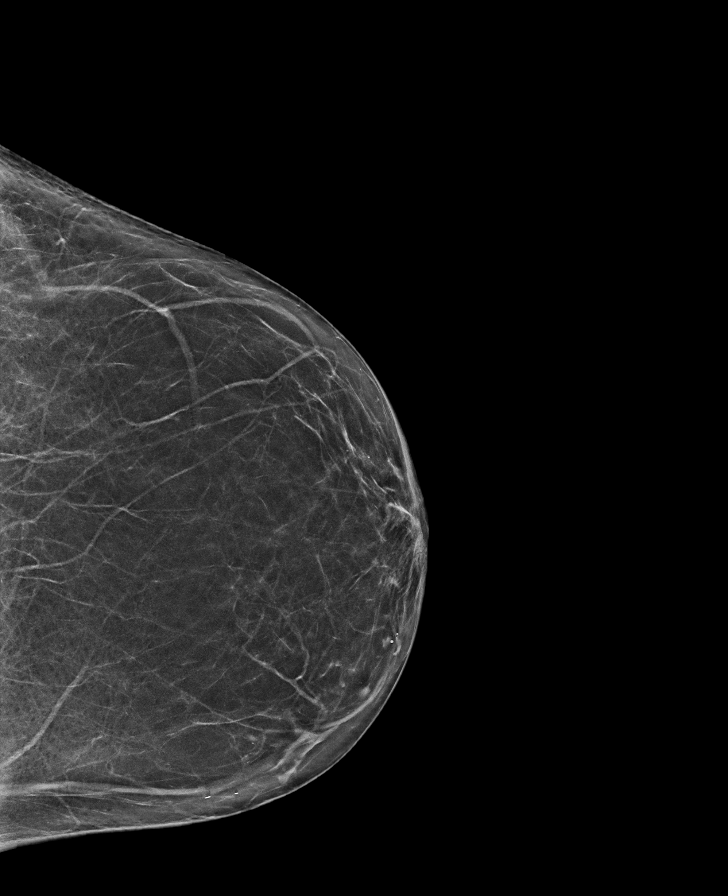

[L MLO tomo · tomo slice 37/74.0]
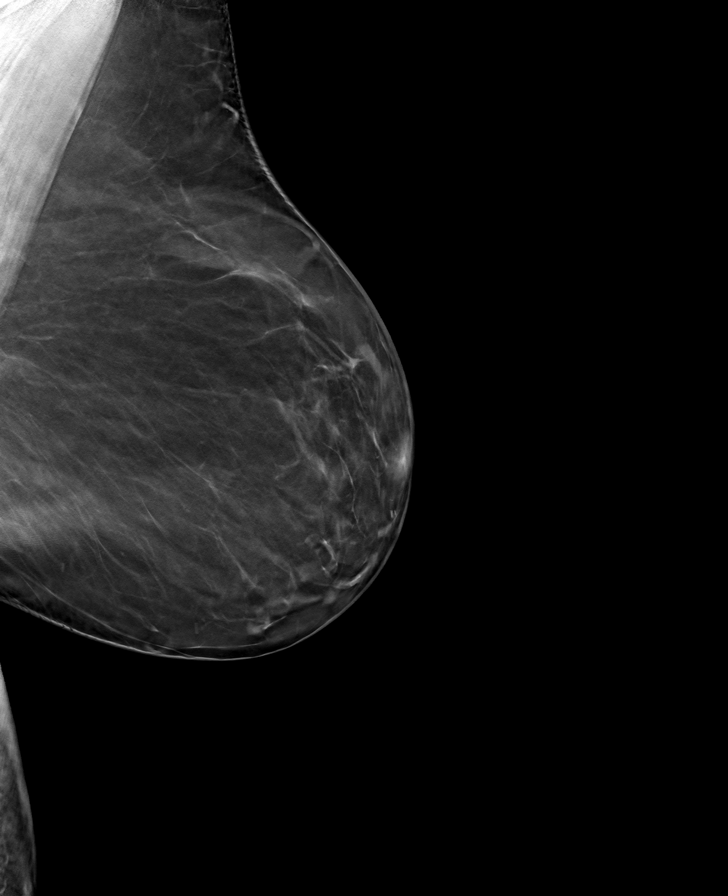

[R CC tomo · tomo slice 31/60.0]
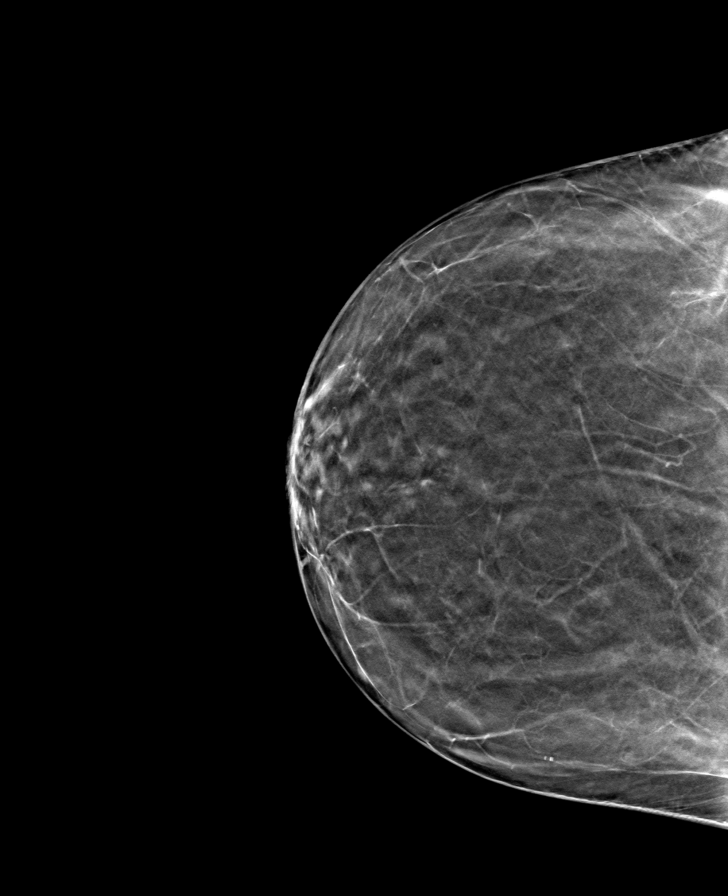

[L CC tomo · tomo slice 33/64.0]
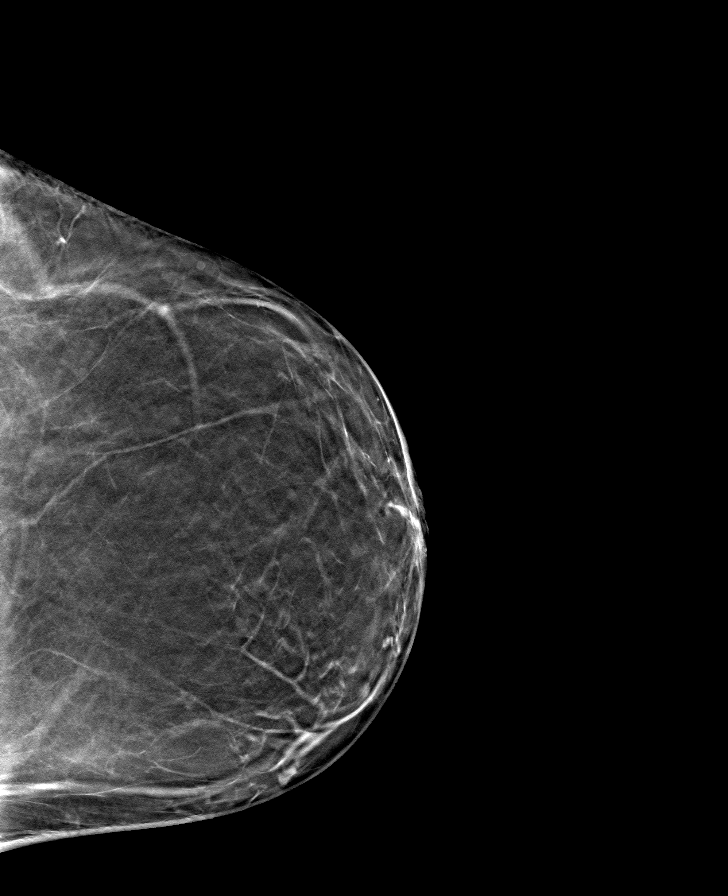

[R MLO tomo · tomo slice 37/73.0]
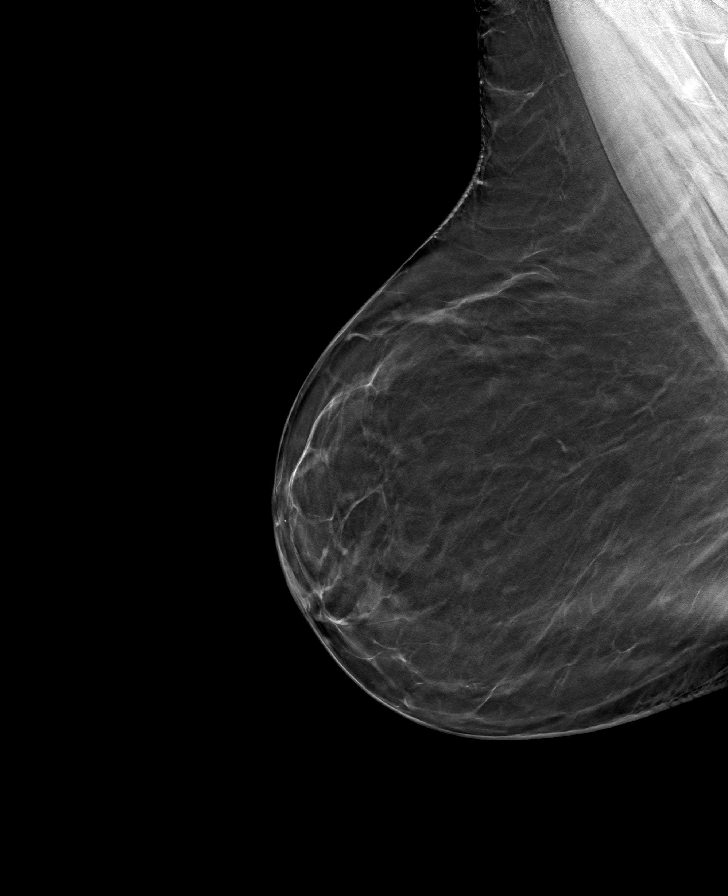

[8 of 24 positions shown; findings below may reference images not displayed]

FINDINGS: There are no findings suspicious for malignancy.
IMPRESSION: No mammographic evidence of malignancy. A result letter of this
screening mammogram will be mailed directly to the patient.

RECOMMENDATION:
Screening mammogram in one year. (Code:0E-3-N98)

BI-RADS CATEGORY  1: Negative.

## 2022-04-30 ENCOUNTER — Other Ambulatory Visit: Payer: Self-pay | Admitting: Internal Medicine

## 2022-04-30 DIAGNOSIS — Z1231 Encounter for screening mammogram for malignant neoplasm of breast: Secondary | ICD-10-CM

## 2022-06-01 ENCOUNTER — Ambulatory Visit: Payer: Medicare Other

## 2022-07-04 ENCOUNTER — Ambulatory Visit: Payer: Medicare Other

## 2022-07-19 ENCOUNTER — Encounter: Payer: Self-pay | Admitting: Gastroenterology

## 2022-07-30 ENCOUNTER — Encounter: Payer: Self-pay | Admitting: Gastroenterology

## 2022-08-09 ENCOUNTER — Ambulatory Visit
Admission: RE | Admit: 2022-08-09 | Discharge: 2022-08-09 | Disposition: A | Discharge status: Home / Self Care | Payer: Medicare Other | Source: Ambulatory Visit | Attending: Internal Medicine | Admitting: Internal Medicine

## 2022-08-09 DIAGNOSIS — Z1231 Encounter for screening mammogram for malignant neoplasm of breast: Secondary | ICD-10-CM

## 2022-08-24 ENCOUNTER — Ambulatory Visit (AMBULATORY_SURGERY_CENTER): Payer: Medicare Other | Admitting: *Deleted

## 2022-08-24 VITALS — Ht 64.0 in | Wt 200.0 lb

## 2022-08-24 DIAGNOSIS — Z860100 Personal history of colon polyps, unspecified: Secondary | ICD-10-CM

## 2022-08-24 DIAGNOSIS — Z800 Family history of malignant neoplasm of digestive organs: Secondary | ICD-10-CM

## 2022-08-24 MED ORDER — PEG 3350-KCL-NA BICARB-NACL 420 G PO SOLR: 4000.0000 mL | Freq: Once | ORAL | 0 refills | Status: AC

## 2022-08-24 NOTE — Progress Notes (Signed)
No egg or soy allergy known to patient  No issues known to pt with past sedation with any surgeries or procedures Patient denies ever being told they had issues or difficulty with intubation  No FH of Malignant Hyperthermia Pt is not on diet pills Pt is not on  home 02  Pt is not on blood thinners  Pt denies issues with constipation  No A fib or A flutter Have any cardiac testing pending--no Pt instructed to use Single[employer] or GoodRx for a price reduction on prep    Pt.stated "I can't take the suprep"  Pt.given 2 day prep d/t inadequate prep 07/12/21,she decline zofran.  Patient's chart reviewed by Osvaldo Angst CNRA prior to previsit and patient appropriate for the [facility].  Previsit completed and red dot placed by patient's name on their procedure day (on provider's schedule).

## 2022-09-19 ENCOUNTER — Encounter: Payer: Self-pay | Admitting: Gastroenterology

## 2022-09-19 ENCOUNTER — Ambulatory Visit (AMBULATORY_SURGERY_CENTER): Payer: Medicare Other | Admitting: Gastroenterology

## 2022-09-19 VITALS — BP 124/69 | HR 87 | Temp 98.2°F | Resp 19 | Ht 64.0 in | Wt 200.0 lb

## 2022-09-19 DIAGNOSIS — D122 Benign neoplasm of ascending colon: Secondary | ICD-10-CM | POA: Diagnosis not present

## 2022-09-19 DIAGNOSIS — Z09 Encounter for follow-up examination after completed treatment for conditions other than malignant neoplasm: Secondary | ICD-10-CM

## 2022-09-19 DIAGNOSIS — Z860100 Personal history of colon polyps, unspecified: Secondary | ICD-10-CM

## 2022-09-19 DIAGNOSIS — Z800 Family history of malignant neoplasm of digestive organs: Secondary | ICD-10-CM

## 2022-09-19 MED ORDER — SODIUM CHLORIDE 0.9 % IV SOLN: 500.0000 mL | Freq: Once | INTRAVENOUS | Status: DC

## 2022-09-19 NOTE — Progress Notes (Unsigned)
[facility] History and Physical   Primary Care Physician:  Cain Saupe, MD   Reason for Procedure:  History of adenomatous colon polyps  Plan:    Surveillance colonoscopy with possible interventions as needed     HPI: Barbara Delacruz is a very pleasant 67 y.o. female here for surveillance colonoscopy. Denies any nausea, vomiting, abdominal pain, melena or bright red blood per rectum  The risks and benefits as well as alternatives of endoscopic procedure(s) have been discussed and reviewed. All questions answered. The patient agrees to proceed.    Past Medical History:  Diagnosis Date   Arthritis    bilateral knees   Carpal tunnel syndrome 2015   bilateral-sx   Hypertension    Hypothyroidism    Seasonal allergies    Wears glasses     Past Surgical History:  Procedure Laterality Date   BREAST EXCISIONAL BIOPSY Left 1988   CARPAL TUNNEL RELEASE Bilateral 09/07/2014   Procedure: RIGHT AND LEFT CARPAL TUNNEL RELEASE;  Surgeon: Daryll Brod, MD;  Location: [facility];  Service: Orthopedics;  Laterality: Bilateral;   COLONOSCOPY  2017   KN-MAC-suprep(good)-tics/TA x 2- 5 yr recall   HYSTEROSCOPY WITH D & C  2018   POLYPECTOMY  2017   TA x 2   TUBAL LIGATION  1991   bilateral   WISDOM TOOTH EXTRACTION  1974    Prior to Admission medications   Medication Sig Start Date End Date Taking? Authorizing Provider  B Complex Vitamins (VITAMIN-B COMPLEX PO) Take by mouth daily.   Yes [provider]  cycloSPORINE, PF, (CEQUA) 0.09 % SOLN Apply to eye in the morning and at bedtime.   Yes [provider]  estradiol (VIVELLE-DOT) 0.05 MG/24HR patch 1 patch 2 (two) times a week. 07/06/21  Yes [provider]  hydrochlorothiazide (HYDRODIURIL) 25 MG tablet Take 25 mg by mouth daily.   Yes [provider]  levothyroxine (SYNTHROID, LEVOTHROID) 150 MCG tablet Take 150 mcg by mouth daily before breakfast.   Yes [provider]  losartan (COZAAR) 50 MG tablet Take 50 mg by mouth daily.   Yes [provider]  Multiple Vitamins-Minerals (MULTIVITAMIN WITH MINERALS) tablet Take 1 tablet by mouth daily.   Yes [provider]  Olopatadine HCl 0.2 % SOLN Pataday 0.2 % eye drops  USE 1 DROP IN OU QD   Yes [provider]  progesterone (PROMETRIUM) 100 MG capsule Take 100 mg by mouth daily. 07/02/21  Yes [provider]  celecoxib (CELEBREX) 200 MG capsule Take 200 mg by mouth daily as needed.    [provider]  VITAMIN D, CHOLECALCIFEROL, PO Take 250 mg by mouth daily.    [provider]    Current Outpatient Medications  Medication Sig Dispense Refill   B Complex Vitamins (VITAMIN-B COMPLEX PO) Take by mouth daily.     cycloSPORINE, PF, (CEQUA) 0.09 % SOLN Apply to eye in the morning and at bedtime.     estradiol (VIVELLE-DOT) 0.05 MG/24HR patch 1 patch 2 (two) times a week.     hydrochlorothiazide (HYDRODIURIL) 25 MG tablet Take 25 mg by mouth daily.     levothyroxine (SYNTHROID, LEVOTHROID) 150 MCG tablet Take 150 mcg by mouth daily before breakfast.     losartan (COZAAR) 50 MG tablet Take 50 mg by mouth daily.     Multiple Vitamins-Minerals (MULTIVITAMIN WITH MINERALS) tablet Take 1 tablet by mouth daily.     Olopatadine HCl 0.2 % SOLN  Pataday 0.2 % eye drops  USE 1 DROP IN OU QD     progesterone (PROMETRIUM) 100 MG capsule Take 100 mg by mouth daily.     celecoxib (CELEBREX) 200 MG capsule Take 200 mg by mouth daily as needed.     VITAMIN D, CHOLECALCIFEROL, PO Take 250 mg by mouth daily.     Current Facility-Administered Medications  Medication Dose Route Frequency Provider Last Rate Last Admin   0.9 %  sodium chloride infusion  500 mL Intravenous Once Mauri Pole, MD        Allergies as of 09/19/2022 - Review Complete 09/19/2022  Allergen Reaction Noted   Cephalosporins Hives and Other (See Comments) 05/18/2009    Sulfamethoxazole-trimethoprim Hives and Other (See Comments) 05/18/2009   Codeine Other (See Comments) 05/18/2009   Ibuprofen Other (See Comments) 05/18/2009   Latex Itching and Other (See Comments) 05/18/2009   Meperidine hcl  05/18/2009   Naproxen Other (See Comments) 05/18/2009   Penicillins Swelling and Other (See Comments) 05/18/2009    Family History  Problem Relation Age of Onset   Colon polyps Mother    Diabetes Mother    Heart disease Mother    Diabetes Father    Heart disease Father    Colon cancer Paternal Uncle    Prostate cancer Paternal Uncle    Colon cancer Cousin    Ulcerative colitis Cousin    Esophageal cancer Neg Hx    Rectal cancer Neg Hx    Stomach cancer Neg Hx    Crohn's disease Neg Hx     Social History   Socioeconomic History   Marital status: Divorced    Spouse name: Not on file   Number of children: Not on file   Years of education: Not on file   Highest education level: Not on file  Occupational History   Not on file  Tobacco Use   Smoking status: Never    Passive exposure: Past   Smokeless tobacco: Never  Vaping Use   Vaping Use: Never used  Substance and Sexual Activity   Alcohol use: Not Currently    Comment: special occasions   Drug use: No   Sexual activity: Not on file  Other Topics Concern   Not on file  Social History Narrative   Not on file   Social Determinants of Health   Financial Resource Strain: Not on file  Food Insecurity: Not on file  Transportation Needs: Not on file  Physical Activity: Not on file  Stress: Not on file  Social Connections: Not on file  Intimate Partner Violence: Not on file    Review of Systems:  All other review of systems negative except as mentioned in the HPI.  Physical Exam: Vital signs in last 24 hours: Blood Pressure 138/85   Pulse (Abnormal) 101   Temperature 98.2 F (36.8 C) (Skin)   Respiration 11   Height _0  (1.626 m)   Weight 200 lb (90.7 kg)   Oxygen Saturation  97%   Body Mass Index 34.33 kg/m  General:   Alert, NAD Lungs:  Clear .   Heart:  Regular rate and rhythm Abdomen:  Soft, nontender and nondistended. Neuro/Psych:  Alert and cooperative. Normal mood and affect. A and O x 3  Reviewed labs, radiology imaging, old records and pertinent past GI work up  Patient is appropriate for planned procedure(s) and anesthesia in an ambulatory setting   K. Denzil Magnuson , MD [phone]

## 2022-09-19 NOTE — Progress Notes (Unsigned)
Report to pacu rn. Vss. Care resumed by rn. 

## 2022-09-19 NOTE — Op Note (Signed)
[facility] Patient Name: Barbara Delacruz Procedure Date: 09/19/2022 9:24 AM MRN: [id number] Endoscopist: Mauri Pole , MD, [id number] Age: 67 Referring MD:  Date of Birth: [birth year] Gender: Female Account #: [id number] Procedure:                Colonoscopy Indications:              High risk colon cancer surveillance: Personal                            history of colonic polyps, Surveillance: History of                            numerous (> 10) adenomas on last colonoscopy (< 3                            yrs) Medicines:                Monitored Anesthesia Care Procedure:                Pre-Anesthesia Assessment:                           - Prior to the procedure, a History and Physical                            was performed, and patient medications and                            allergies were reviewed. The patient's tolerance of                            previous anesthesia was also reviewed. The risks                            and benefits of the procedure and the sedation                            options and risks were discussed with the patient.                            All questions were answered, and informed consent                            was obtained. Prior Anticoagulants: The patient has                            taken no anticoagulant or antiplatelet agents. ASA                            Grade Assessment: II - A patient with mild systemic                            disease. After reviewing the risks and benefits,  the patient was deemed in satisfactory condition to                            undergo the procedure.                           After obtaining informed consent, the colonoscope                            was passed under direct vision. Throughout the                            procedure, the patient's blood pressure, pulse, and                            oxygen saturations were monitored continuously. The                             Olympus PCF-H190DL ([id number]) Colonoscope was                            introduced through the anus and advanced to the the                            cecum, identified by appendiceal orifice and                            ileocecal valve. The colonoscopy was somewhat                            difficult due to inadequate bowel prep, a tortuous                            colon and the patient's body habitus. Successful                            completion of the procedure was aided by applying                            abdominal pressure and lavage. The patient                            tolerated the procedure well. The quality of the                            bowel preparation was adequate to identify polyps                            greater than 5 mm in size. The ileocecal valve,                            appendiceal orifice, and rectum were photographed. Scope In: 9:40:38 AM Scope Out: 10:16:16 AM Scope Withdrawal Time: 0 hours 12 minutes 36 seconds  Total  Procedure Duration: 0 hours 35 minutes 38 seconds  Findings:                 The perianal and digital rectal examinations were                            normal.                           Two sessile polyps were found in the ascending                            colon. The polyps were 6 to 8 mm in size. These                            polyps were removed with a cold snare. Resection                            and retrieval were complete.                           A few small-mouthed diverticula were found in the                            sigmoid colon and descending colon.                           Non-bleeding external and internal hemorrhoids were                            found during retroflexion. The hemorrhoids were                            medium-sized. Complications:            No immediate complications. Estimated Blood Loss:     Estimated blood loss was minimal. Impression:                - Two 6 to 8 mm polyps in the ascending colon,                            removed with a cold snare. Resected and retrieved.                           - Diverticulosis in the sigmoid colon and in the                            descending colon.                           - Non-bleeding external and internal hemorrhoids. Recommendation:           - Patient has a contact number available for                            emergencies. The signs and symptoms of potential  delayed complications were discussed with the                            patient. Return to normal activities tomorrow.                            Written discharge instructions were provided to the                            patient.                           - Resume previous diet.                           - Continue present medications.                           - Await pathology results.                           - Repeat colonoscopy in 3 years for surveillance                            based on pathology results.                           - Refer to a genetics counselor at the next                            available appointment for genetic testing to                            excluded attenuated polyposis syndromes. Mauri Pole, MD 09/19/2022 10:25:08 AM This report has been signed electronically.

## 2022-09-19 NOTE — Patient Instructions (Addendum)
Handout on polyps, hemorrhoids, and diverticulosis given to patient. Await pathology results. Resume previous diet and continue present medications. Repeat colonoscopy for surveillance in 3 years based off of pathology results. Referral to a genetics counselor at the next available appointment for genetic testing to exclude attenuated polyposis syndromes.   YOU HAD AN ENDOSCOPIC PROCEDURE TODAY AT [facility]:   Refer to the procedure report that was given to you for any specific questions about what was found during the examination.  If the procedure report does not answer your questions, please call your gastroenterologist to clarify.  If you requested that your care partner not be given the details of your procedure findings, then the procedure report has been included in a sealed envelope for you to review at your convenience later.  YOU SHOULD EXPECT: Some feelings of bloating in the abdomen. Passage of more gas than usual.  Walking can help get rid of the air that was put into your GI tract during the procedure and reduce the bloating. If you had a lower endoscopy (such as a colonoscopy or flexible sigmoidoscopy) you may notice spotting of blood in your stool or on the toilet paper. If you underwent a bowel prep for your procedure, you may not have a normal bowel movement for a few days.  Please Note:  You might notice some irritation and congestion in your nose or some drainage.  This is from the oxygen used during your procedure.  There is no need for concern and it should clear up in a day or so.  SYMPTOMS TO REPORT IMMEDIATELY:  Following lower endoscopy (colonoscopy or flexible sigmoidoscopy):  Excessive amounts of blood in the stool  Significant tenderness or worsening of abdominal pains  Swelling of the abdomen that is new, acute  Fever of 100F or higher  Following upper endoscopy (EGD)  Vomiting of blood or coffee ground material  New chest pain or pain under  the shoulder blades  Painful or persistently difficult swallowing  New shortness of breath  Fever of 100F or higher  Black, tarry-looking stools  For urgent or emergent issues, a gastroenterologist can be reached at any hour by calling [phone]. Do not use MyChart messaging for urgent concerns.    DIET:  We do recommend a small meal at first, but then you may proceed to your regular diet.  Drink plenty of fluids but you should avoid alcoholic beverages for 24 hours.  ACTIVITY:  You should plan to take it easy for the rest of today and you should NOT DRIVE or use heavy machinery until tomorrow (because of the sedation medicines used during the test).    FOLLOW UP: Our staff will call the number listed on your records the next business day following your procedure.  We will call around 7:15- 8:00 am to check on you and address any questions or concerns that you may have regarding the information given to you following your procedure. If we do not reach you, we will leave a message.     If any biopsies were taken you will be contacted by phone or by letter within the next 1-3 weeks.  Please call us at [phone] if you have not heard about the biopsies in 3 weeks.    SIGNATURES/CONFIDENTIALITY: You and/or your care partner have signed paperwork which will be entered into your electronic medical record.  These signatures attest to the fact that that the information above on your After Visit Summary has been  reviewed and is understood.  Full responsibility of the confidentiality of this discharge information lies with you and/or your care-partner. 

## 2022-09-19 NOTE — Progress Notes (Signed)
Called to room to assist during endoscopic procedure.  Patient ID and intended procedure confirmed with present staff. Received instructions for my participation in the procedure from the performing physician.  

## 2022-09-19 NOTE — Progress Notes (Unsigned)
Pt's states no medical or surgical changes since previsit or office visit. VS assessed by C.W 

## 2022-09-20 ENCOUNTER — Telehealth: Payer: Self-pay

## 2022-09-20 NOTE — Telephone Encounter (Signed)
  Follow up Call-     09/19/2022    8:22 AM 07/12/2021   10:31 AM  Call back number  Post procedure Call Back phone  # [phone]  Permission to leave phone message Yes Yes     Patient questions:  Do you have a fever, pain , or abdominal swelling? No. Pain Score  0 *  Have you tolerated food without any problems? Yes.    Have you been able to return to your normal activities? Yes.    Do you have any questions about your discharge instructions: Diet   No. Medications  No. Follow up visit  No.  Do you have questions or concerns about your Care? No.  Actions: * If pain score is 4 or above: No action needed, pain <4.

## 2022-09-21 ENCOUNTER — Other Ambulatory Visit: Payer: Self-pay

## 2022-09-21 DIAGNOSIS — D369 Benign neoplasm, unspecified site: Secondary | ICD-10-CM

## 2022-09-21 DIAGNOSIS — K635 Polyp of colon: Secondary | ICD-10-CM

## 2022-09-21 DIAGNOSIS — Z800 Family history of malignant neoplasm of digestive organs: Secondary | ICD-10-CM

## 2022-09-27 ENCOUNTER — Encounter: Payer: Self-pay | Admitting: Gastroenterology

## 2022-11-06 ENCOUNTER — Encounter: Payer: Self-pay | Admitting: Internal Medicine

## 2022-11-06 DIAGNOSIS — Z13820 Encounter for screening for osteoporosis: Secondary | ICD-10-CM

## 2022-11-07 ENCOUNTER — Encounter: Payer: Self-pay | Admitting: Internal Medicine

## 2022-11-08 ENCOUNTER — Encounter: Payer: Self-pay | Admitting: Internal Medicine

## 2022-11-08 ENCOUNTER — Other Ambulatory Visit: Payer: Self-pay | Admitting: Internal Medicine

## 2022-11-19 ENCOUNTER — Encounter: Payer: Medicare Other | Admitting: Licensed Clinical Social Worker

## 2022-11-19 ENCOUNTER — Other Ambulatory Visit: Payer: Medicare Other

## 2022-11-23 ENCOUNTER — Other Ambulatory Visit: Payer: Self-pay | Admitting: Internal Medicine

## 2022-11-27 ENCOUNTER — Other Ambulatory Visit: Payer: Self-pay | Admitting: Internal Medicine

## 2022-11-27 DIAGNOSIS — Z1231 Encounter for screening mammogram for malignant neoplasm of breast: Secondary | ICD-10-CM

## 2022-11-29 ENCOUNTER — Other Ambulatory Visit (HOSPITAL_BASED_OUTPATIENT_CLINIC_OR_DEPARTMENT_OTHER): Payer: Self-pay | Admitting: Internal Medicine

## 2022-11-29 ENCOUNTER — Other Ambulatory Visit: Payer: Self-pay

## 2022-11-29 ENCOUNTER — Ambulatory Visit (HOSPITAL_BASED_OUTPATIENT_CLINIC_OR_DEPARTMENT_OTHER)
Admission: RE | Admit: 2022-11-29 | Discharge: 2022-11-29 | Disposition: A | Discharge status: Home / Self Care | Payer: Medicare Other | Source: Ambulatory Visit | Attending: Internal Medicine | Admitting: Internal Medicine

## 2022-11-29 ENCOUNTER — Encounter: Payer: Self-pay | Admitting: Internal Medicine

## 2022-11-29 ENCOUNTER — Other Ambulatory Visit: Payer: Self-pay | Admitting: Internal Medicine

## 2022-11-29 DIAGNOSIS — E039 Hypothyroidism, unspecified: Secondary | ICD-10-CM | POA: Diagnosis not present

## 2022-11-29 DIAGNOSIS — Z13820 Encounter for screening for osteoporosis: Secondary | ICD-10-CM

## 2022-11-29 DIAGNOSIS — Z780 Asymptomatic menopausal state: Secondary | ICD-10-CM | POA: Diagnosis not present

## 2024-08-24 ENCOUNTER — Other Ambulatory Visit: Payer: Self-pay | Admitting: Obstetrics

## 2024-08-24 DIAGNOSIS — R928 Other abnormal and inconclusive findings on diagnostic imaging of breast: Secondary | ICD-10-CM

## 2024-08-28 ENCOUNTER — Inpatient Hospital Stay: Admission: RE | Admit: 2024-08-28 | Discharge: 2024-08-28 | Attending: Obstetrics

## 2024-08-28 ENCOUNTER — Other Ambulatory Visit

## 2024-08-28 DIAGNOSIS — R928 Other abnormal and inconclusive findings on diagnostic imaging of breast: Secondary | ICD-10-CM
